# Patient Record
Sex: Male | Born: 1945 | Race: White | Hispanic: No | Marital: Married | Smoking: Former smoker
Health system: Southern US, Community
[De-identification: ages and names within clinical notes are randomized; demographics above are authoritative.]

## PROBLEM LIST (undated history)

## (undated) DIAGNOSIS — G4730 Sleep apnea, unspecified: Secondary | ICD-10-CM

## (undated) DIAGNOSIS — E785 Hyperlipidemia, unspecified: Secondary | ICD-10-CM

## (undated) DIAGNOSIS — I10 Essential (primary) hypertension: Secondary | ICD-10-CM

## (undated) HISTORY — DX: Hyperlipidemia, unspecified: E78.5

## (undated) HISTORY — DX: Essential (primary) hypertension: I10

## (undated) HISTORY — DX: Sleep apnea, unspecified: G47.30

---

## 2016-06-13 ENCOUNTER — Ambulatory Visit (INDEPENDENT_AMBULATORY_CARE_PROVIDER_SITE_OTHER): Payer: Medicare HMO | Admitting: Podiatry

## 2016-06-13 ENCOUNTER — Encounter: Payer: Self-pay | Admitting: Podiatry

## 2016-06-13 VITALS — BP 145/68 | HR 64 | Ht 75.0 in | Wt 269.0 lb

## 2016-06-13 DIAGNOSIS — M216X2 Other acquired deformities of left foot: Secondary | ICD-10-CM

## 2016-06-13 DIAGNOSIS — M205X9 Other deformities of toe(s) (acquired), unspecified foot: Secondary | ICD-10-CM

## 2016-06-13 DIAGNOSIS — M79672 Pain in left foot: Secondary | ICD-10-CM | POA: Diagnosis not present

## 2016-06-13 DIAGNOSIS — M79671 Pain in right foot: Secondary | ICD-10-CM | POA: Diagnosis not present

## 2016-06-13 DIAGNOSIS — M216X1 Other acquired deformities of right foot: Secondary | ICD-10-CM

## 2016-06-13 DIAGNOSIS — M21969 Unspecified acquired deformity of unspecified lower leg: Secondary | ICD-10-CM

## 2016-06-13 NOTE — Progress Notes (Signed)
Pain in the big joint on right foot duration of a year and is getting worse. HTN, Cholesterol, Blood sugar is ok.  SUBJECTIVE: [age]y.o. year old male presents complaining of constant pain in right big joint and occasional pain on left foot. Started a year ago and now has gotten much worse with more pain.  REVIEW OF SYSTEMS: A comprehensive review of systems was negative except for: Hypertension and High Cholesterol.   OBJECTIVE: DERMATOLOGIC EXAMINATION: No abnormal skin lesions.  VASCULAR EXAMINATION OF LOWER LIMBS: All pedal pulses are palpable with normal pulsation.  Capillary Filling times within 3 seconds in all digits.  Mild edema and erythema over the first MPJ right. Temperature gradient from tibial crest to dorsum of foot is within normal bilateral.  NEUROLOGIC EXAMINATION OF THE LOWER LIMBS: All epicritic and tactile sensations grossly intact. Sharp and Dull discriminatory sensations at the plantar ball of hallux is intact bilateral.   MUSCULOSKELETAL EXAMINATION: Positive for limited first MPJ motion R>L. Positive for elevated and enlarged first Metatarsal head bilateral R>L.  RADIOGRAPHIC STUDIES:  AP View:  Long first metatarsal with narrowing of joint space first MPJ R>L. Increased lateral deviation angle of the CCJ bilateral. Lateral view:  Severely elevated first ray with dorsal spur at the head of the first metatarsal and at the base of the proximal phalanx bilateral. Plantar calcaneal spur bilateral.  ASSESSMENT: Hallux limtitus bilateral. Bone spur dorsal first MPJ bilateral R>L. Metatarsus primus elevatus bilateral.  PLAN: Reviewed clinical findings and available treatment options, injection, orthotics, surgical. Surgery consent form signed for Biplane osteotomy of the first metatarsal right. Right first MPJ injected with mixture of 4 mg Dexamethasone, 4 mg Triamcinolone, and 1 cc of 0.5% Marcaine plain. Patient tolerated well without difficulty.  Patient  will contact us when he is ready for the right foot surgery.

## 2016-06-13 NOTE — Patient Instructions (Addendum)
Seen for pain in right first MPJ. Noted of limited joint motion with spur and bone fragment in joint space on top. May require decompression osteotomy with biplane plantar flexory osteotomy with cleaning of spur and loose bone. Cortisone injection given to right first MPJ. Return or call when ready for surgery or if needed another injection.

## 2016-11-27 ENCOUNTER — Ambulatory Visit (INDEPENDENT_AMBULATORY_CARE_PROVIDER_SITE_OTHER): Payer: Medicare HMO | Admitting: Podiatry

## 2016-11-27 ENCOUNTER — Encounter: Payer: Self-pay | Admitting: Podiatry

## 2016-11-27 DIAGNOSIS — M25571 Pain in right ankle and joints of right foot: Secondary | ICD-10-CM

## 2016-11-27 DIAGNOSIS — M205X9 Other deformities of toe(s) (acquired), unspecified foot: Secondary | ICD-10-CM | POA: Diagnosis not present

## 2016-11-27 DIAGNOSIS — M21969 Unspecified acquired deformity of unspecified lower leg: Secondary | ICD-10-CM | POA: Diagnosis not present

## 2016-11-27 NOTE — Progress Notes (Signed)
SUBJECTIVE: 71 y.o. year old male accompanied by his wife presents complaining of constant pain in right big joint and and request for injection. The last injection in April 2018 lasted a good while.  HPI: Pain in the big joint on right foot duration of a year and is getting worse. HTN, Cholesterol, Blood sugar is ok.   OBJECTIVE: DERMATOLOGIC EXAMINATION: No abnormal skin lesions.  VASCULAR EXAMINATION OF LOWER LIMBS: All pedal pulses are palpable with normal pulsation.  Capillary Filling times within 3 seconds in all digits.  Mild edema and erythema over the first MPJ right. Temperature gradient from tibial crest to dorsum of foot is within normal bilateral.  NEUROLOGIC EXAMINATION OF THE LOWER LIMBS: All epicritic and tactile sensations grossly intact. Sharp and Dull discriminatory sensations at the plantar ball of hallux is intact bilateral.   MUSCULOSKELETAL EXAMINATION: Positive for limited first MPJ motion R>L. Positive for elevated and enlarged first Metatarsal head bilateral R>L.  Previous x-ray study show; Long first metatarsal with narrowing of joint space first MPJ R>L. Increased lateral deviation angle of the CCJ bilateral. Lateral view:  Severely elevated first ray with dorsal spur at the head of the first metatarsal and at the base of the proximal phalanx bilateral. Plantar calcaneal spur bilateral.  ASSESSMENT: Hallux limtitus bilateral. Bone spur dorsal first MPJ bilateral R>L. Metatarsus primus elevatus bilateral.  PLAN: Reviewed clinical findings and available treatment options, injection, orthotics, surgical. Right first MPJ injected with mixture of 4 mg Dexamethasone, 4 mg Triamcinolone, and 1 cc of 0.5% Marcaine plain. Patient tolerated well without difficulty.  Patient will return in December for pre op paper for Biplane [facility] bunionectomy right foot.

## 2016-11-27 NOTE — Patient Instructions (Signed)
Seen for painful joint right great toe. Injection given. Return in December for pre op paper to re sign.

## 2017-02-26 ENCOUNTER — Ambulatory Visit: Payer: Medicare HMO | Admitting: Podiatry

## 2017-02-26 ENCOUNTER — Encounter: Payer: Self-pay | Admitting: Podiatry

## 2017-02-26 DIAGNOSIS — M25571 Pain in right ankle and joints of right foot: Secondary | ICD-10-CM | POA: Diagnosis not present

## 2017-02-26 DIAGNOSIS — M21969 Unspecified acquired deformity of unspecified lower leg: Secondary | ICD-10-CM | POA: Diagnosis not present

## 2017-02-26 DIAGNOSIS — M205X9 Other deformities of toe(s) (acquired), unspecified foot: Secondary | ICD-10-CM | POA: Diagnosis not present

## 2017-02-26 NOTE — Progress Notes (Signed)
Subjective: 71 y.o. year old male patient presents accompanied by his wife for pre op on right big joint pain. Patient and wife wants to have the right foot surgery in January 2019. Been having pain for at lease 2 years. Also developed a painful knot under the 2nd MPJ area of left foot that is getting better now.   History of back pain from old injury and takes Ibuprofen. Been treated for HTN, hihg Cholesterol. Blood sugar run 130-145.                 Objective: Dermatologic: Thick yellow deformed nails both great toes. Vascular: Pedal pulses are all palpable in normal pulsation PT, faintly palpable DP bilateral. Orthopedic: Enlarged metatarsal head, elevated first ray, long first metatarsal, limited first MPJ dorsiflexion bilateral. Neurologic: All epicritic and tactile sensations grossly intact.  Assessment: Painful Hallux limitus with long first ray bilateral R>L.  Treatment: Reviewed clinical findings and available options. Biplane [place] procedure reviewed with both patient and his wife.

## 2017-02-26 NOTE — Patient Instructions (Signed)
Seen for Pre op consultation on right foot for painful joint and bunion. Reviewed proposed procedure and questions answered. Will schedule right foot surgery in January 2019.

## 2017-03-12 ENCOUNTER — Ambulatory Visit (HOSPITAL_BASED_OUTPATIENT_CLINIC_OR_DEPARTMENT_OTHER)
Admission: RE | Admit: 2017-03-12 | Discharge: 2017-03-12 | Disposition: A | Discharge status: Home / Self Care | Payer: Medicare HMO | Source: Ambulatory Visit | Attending: Cardiology | Admitting: Cardiology

## 2017-03-12 ENCOUNTER — Encounter: Payer: Self-pay | Admitting: Cardiology

## 2017-03-12 ENCOUNTER — Ambulatory Visit (INDEPENDENT_AMBULATORY_CARE_PROVIDER_SITE_OTHER): Payer: Medicare HMO | Admitting: Cardiology

## 2017-03-12 VITALS — BP 140/80 | HR 71 | Ht 75.0 in | Wt 272.0 lb

## 2017-03-12 DIAGNOSIS — I10 Essential (primary) hypertension: Secondary | ICD-10-CM

## 2017-03-12 DIAGNOSIS — E785 Hyperlipidemia, unspecified: Secondary | ICD-10-CM

## 2017-03-12 DIAGNOSIS — R079 Chest pain, unspecified: Secondary | ICD-10-CM | POA: Insufficient documentation

## 2017-03-12 NOTE — Progress Notes (Signed)
Cardiology Office Note:    Date:  03/12/2017   ID:  Randy GauzeFarrell C Dullea, DOB 02-14-1946, MRN [id number]  PCP:  Patient, No Pcp Per  Cardiologist:  Norman HerrlichBrian Jersee Winiarski, MD   Referring MD: No ref. provider found  ASSESSMENT:    1. Chest pain in adult   2. Essential hypertension   3. Hyperlipidemia, unspecified hyperlipidemia type    PLAN:    In order of problems listed above:  1. With abnormal myocardial perfusion study from March 2017 I feel he should undergo coronary angiography as he is at high risk.  He is improved since resuming beta-blocker will continue aspirin beta blocker and statin check troponin with his labs today and plan coronary angiography in the next 48 hours.  Options benefits and risks detailed patient and wife are in agreement he has no dye allergy or known renal insufficiency. 2. Stable continue current treatment including ACE inhibitor beta-blocker 3. Stable continue statin, if he has obstructive CAD he will need high intensity statin  Next appointment 2 weeks, I did ask him to cancel his elective foot surgery for this week   Medication Adjustments/Labs and Tests Ordered: Current medicines are reviewed at length with the patient today.  Concerns regarding medicines are outlined above.  Orders Placed This Encounter  Procedures  . DG Chest 2 View  . CBC with Differential/Platelet  . Basic metabolic panel  . Protime-INR  . Troponin I  . EKG 12-Lead   No orders of the defined types were placed in this encounter.    Chief Complaint  Patient presents with  . Chest Pain    History of Present Illness:    Randy Howell is a 72 y.o. male who is being seen today for the evaluation of chest pain at his request . [facility] was abnormal with inferolateral ischemia.  He was seen by a cardiologist he was felt to have at continuation he was advised to continue medical treatment although wife said there was some discussion of undergoing further evaluation but it  was never scheduled he also has incomplete right bundle branch block on previous EKG. echocardiogram March 2017 showed normal left ventricular ejection fraction mild LVH and grade 2 diastolic dysfunction  Recently he has not felt well and has been limited mostly by foot pain is actually scheduled for outpatient elective podiatric surgery this Friday.  In the last 2 weeks he has had an increasing frequency of aching in the left upper extremity that radiates into the chest without activities.  The symptoms have been off and on now for 2 weeks actually better now and was so severe over the weekend that he thought of going to the emergency room for evaluation.  It is not pleuritic he is not short of breath no diaphoresis or nausea nonexertional on relieved with rest.  His wife intervened and scheduled appointment with me today.  He has multiple cardiovascular risk including hypertension hyperlipidemia previous cigarette smoking and sleep apnea.  He is compliant with his CPAP.  On his own he had discontinued beta-blocker and his systolic blood pressures up to 200 systolic over the weekend and placed himself back on the beta-blocker and attributes his improvement to it.  His outpatient medical regimen includes aspirin beta-blocker and statin he has no known history of congenital rheumatic heart disease   Past Medical History:  Diagnosis Date  . Hyperlipidemia   . Hypertension   . Sleep apnea     History reviewed. No pertinent  surgical history.  Current Medications: Current Meds  Medication Sig  . aspirin EC 81 MG tablet Take 81 mg by mouth daily.  . carvedilol (COREG) 6.25 MG tablet Take 6.25 mg by mouth 2 (two) times daily with a meal. Half tab bid  . cetirizine (ZYRTEC) 10 MG tablet Take 10 mg by mouth daily.  Marland Kitchen co-enzyme Q-10 30 MG capsule Take 30 mg by mouth daily.  . Glucos-Chond-Hyal Ac-Ca Fructo (MOVE FREE JOINT HEALTH ADVANCE PO) Take by mouth daily.  Marland Kitchen ibuprofen (ADVIL,MOTRIN) 800 MG tablet  Take 800 mg by mouth every 8 (eight) hours as needed.  Marland Kitchen lisinopril (PRINIVIL,ZESTRIL) 40 MG tablet Take 40 mg by mouth daily.  . Multiple Vitamins-Minerals (CENTRUM SILVER 50+MEN PO) Take by mouth daily.  . simvastatin (ZOCOR) 80 MG tablet Take 80 mg by mouth daily. Half tab daily     Allergies:   Patient has no known allergies.   Social History   Socioeconomic History  . Marital status: Married    Spouse name: None  . Number of children: None  . Years of education: None  . Highest education level: None  Social Needs  . Financial resource strain: None  . Food insecurity - worry: None  . Food insecurity - inability: None  . Transportation needs - medical: None  . Transportation needs - non-medical: None  Occupational History  . None  Tobacco Use  . Smoking status: Former Games developer  . Smokeless tobacco: Never Used  Substance and Sexual Activity  . Alcohol use: None  . Drug use: None  . Sexual activity: None  Other Topics Concern  . None  Social History Narrative  . None     Family History: The patient's family history includes Cancer in his mother; Liver cancer in his father.  ROS:   Review of Systems  Constitution: Negative.  HENT: Negative.   Eyes: Negative.   Cardiovascular: Positive for chest pain.  Respiratory: Negative.   Endocrine: Negative.   Hematologic/Lymphatic: Negative.   Skin: Negative.   Musculoskeletal: Positive for joint pain (foot).  Gastrointestinal: Negative.   Genitourinary: Negative.   Neurological: Positive for dizziness (with elevated BP).  Psychiatric/Behavioral: Negative.   Allergic/Immunologic: Negative.    Please see the history of present illness.     All other systems reviewed and are negative.  EKGs/Labs/Other Studies Reviewed:    The following studies were reviewed today: Records from the [facility] handcarried with the patient and I reviewed his echocardiogram myocardial perfusion study and cardiology consultation all from  March 2017 EKG today shows sinus rhythm right bundle branch block no ischemic changes  EKG:  EKG is  ordered today.  The ekg ordered today demonstrates right bundle branch block no acute ischemic changes  Recent Labs: No results found for requested labs within last 8760 hours.  Recent Lipid Panel No results found for: CHOL, TRIG, HDL, CHOLHDL, VLDL, LDLCALC, LDLDIRECT  Physical Exam:    VS:  BP 140/80 (BP Location: Left Arm, Patient Position: Sitting, Cuff Size: Large)   Pulse 71   Ht 6\' 3"  (1.905 m)   Wt 272 lb (123.4 kg)   SpO2 97%   BMI 34.00 kg/m     Wt Readings from Last 3 Encounters:  03/12/17 272 lb (123.4 kg)  06/13/16 269 lb (122 kg)     GEN:  Well nourished, well developed in no acute distress HEENT: Normal NECK: No JVD; No carotid bruits LYMPHATICS: No lymphadenopathy CARDIAC: RRR, no murmurs, rubs, gallops RESPIRATORY:  Clear to auscultation without rales, wheezing or rhonchi  ABDOMEN: Soft, non-tender, non-distended MUSCULOSKELETAL:  No edema; No deformity  SKIN: Warm and dry NEUROLOGIC:  Alert and oriented x 3 PSYCHIATRIC:  Normal affect     Signed, Norman Herrlich, MD  03/12/2017 11:24 AM    [facility]

## 2017-03-12 NOTE — H&P (View-Only) (Signed)
Cardiology Office Note:    Date:  03/12/2017   ID:  Randy GauzeFarrell C Dullea, DOB 02-14-1946, MRN [id number]  PCP:  Patient, No Pcp Per  Cardiologist:  Norman HerrlichBrian Rania Prothero, MD   Referring MD: No ref. provider found  ASSESSMENT:    1. Chest pain in adult   2. Essential hypertension   3. Hyperlipidemia, unspecified hyperlipidemia type    PLAN:    In order of problems listed above:  1. With abnormal myocardial perfusion study from March 2017 I feel he should undergo coronary angiography as he is at high risk.  He is improved since resuming beta-blocker will continue aspirin beta blocker and statin check troponin with his labs today and plan coronary angiography in the next 48 hours.  Options benefits and risks detailed patient and wife are in agreement he has no dye allergy or known renal insufficiency. 2. Stable continue current treatment including ACE inhibitor beta-blocker 3. Stable continue statin, if he has obstructive CAD he will need high intensity statin  Next appointment 2 weeks, I did ask him to cancel his elective foot surgery for this week   Medication Adjustments/Labs and Tests Ordered: Current medicines are reviewed at length with the patient today.  Concerns regarding medicines are outlined above.  Orders Placed This Encounter  Procedures  . DG Chest 2 View  . CBC with Differential/Platelet  . Basic metabolic panel  . Protime-INR  . Troponin I  . EKG 12-Lead   No orders of the defined types were placed in this encounter.    Chief Complaint  Patient presents with  . Chest Pain    History of Present Illness:    Randy Howell is a 72 y.o. male who is being seen today for the evaluation of chest pain at his request . [facility] was abnormal with inferolateral ischemia.  He was seen by a cardiologist he was felt to have at continuation he was advised to continue medical treatment although wife said there was some discussion of undergoing further evaluation but it  was never scheduled he also has incomplete right bundle branch block on previous EKG. echocardiogram March 2017 showed normal left ventricular ejection fraction mild LVH and grade 2 diastolic dysfunction  Recently he has not felt well and has been limited mostly by foot pain is actually scheduled for outpatient elective podiatric surgery this Friday.  In the last 2 weeks he has had an increasing frequency of aching in the left upper extremity that radiates into the chest without activities.  The symptoms have been off and on now for 2 weeks actually better now and was so severe over the weekend that he thought of going to the emergency room for evaluation.  It is not pleuritic he is not short of breath no diaphoresis or nausea nonexertional on relieved with rest.  His wife intervened and scheduled appointment with me today.  He has multiple cardiovascular risk including hypertension hyperlipidemia previous cigarette smoking and sleep apnea.  He is compliant with his CPAP.  On his own he had discontinued beta-blocker and his systolic blood pressures up to 200 systolic over the weekend and placed himself back on the beta-blocker and attributes his improvement to it.  His outpatient medical regimen includes aspirin beta-blocker and statin he has no known history of congenital rheumatic heart disease   Past Medical History:  Diagnosis Date  . Hyperlipidemia   . Hypertension   . Sleep apnea     History reviewed. No pertinent  surgical history.  Current Medications: Current Meds  Medication Sig  . aspirin EC 81 MG tablet Take 81 mg by mouth daily.  . carvedilol (COREG) 6.25 MG tablet Take 6.25 mg by mouth 2 (two) times daily with a meal. Half tab bid  . cetirizine (ZYRTEC) 10 MG tablet Take 10 mg by mouth daily.  Marland Kitchen co-enzyme Q-10 30 MG capsule Take 30 mg by mouth daily.  . Glucos-Chond-Hyal Ac-Ca Fructo (MOVE FREE JOINT HEALTH ADVANCE PO) Take by mouth daily.  Marland Kitchen ibuprofen (ADVIL,MOTRIN) 800 MG tablet  Take 800 mg by mouth every 8 (eight) hours as needed.  Marland Kitchen lisinopril (PRINIVIL,ZESTRIL) 40 MG tablet Take 40 mg by mouth daily.  . Multiple Vitamins-Minerals (CENTRUM SILVER 50+MEN PO) Take by mouth daily.  . simvastatin (ZOCOR) 80 MG tablet Take 80 mg by mouth daily. Half tab daily     Allergies:   Patient has no known allergies.   Social History   Socioeconomic History  . Marital status: Married    Spouse name: None  . Number of children: None  . Years of education: None  . Highest education level: None  Social Needs  . Financial resource strain: None  . Food insecurity - worry: None  . Food insecurity - inability: None  . Transportation needs - medical: None  . Transportation needs - non-medical: None  Occupational History  . None  Tobacco Use  . Smoking status: Former Games developer  . Smokeless tobacco: Never Used  Substance and Sexual Activity  . Alcohol use: None  . Drug use: None  . Sexual activity: None  Other Topics Concern  . None  Social History Narrative  . None     Family History: The patient's family history includes Cancer in his mother; Liver cancer in his father.  ROS:   Review of Systems  Constitution: Negative.  HENT: Negative.   Eyes: Negative.   Cardiovascular: Positive for chest pain.  Respiratory: Negative.   Endocrine: Negative.   Hematologic/Lymphatic: Negative.   Skin: Negative.   Musculoskeletal: Positive for joint pain (foot).  Gastrointestinal: Negative.   Genitourinary: Negative.   Neurological: Positive for dizziness (with elevated BP).  Psychiatric/Behavioral: Negative.   Allergic/Immunologic: Negative.    Please see the history of present illness.     All other systems reviewed and are negative.  EKGs/Labs/Other Studies Reviewed:    The following studies were reviewed today: Records from the [facility] handcarried with the patient and I reviewed his echocardiogram myocardial perfusion study and cardiology consultation all from  March 2017 EKG today shows sinus rhythm right bundle branch block no ischemic changes  EKG:  EKG is  ordered today.  The ekg ordered today demonstrates right bundle branch block no acute ischemic changes  Recent Labs: No results found for requested labs within last 8760 hours.  Recent Lipid Panel No results found for: CHOL, TRIG, HDL, CHOLHDL, VLDL, LDLCALC, LDLDIRECT  Physical Exam:    VS:  BP 140/80 (BP Location: Left Arm, Patient Position: Sitting, Cuff Size: Large)   Pulse 71   Ht 6\' 3"  (1.905 m)   Wt 272 lb (123.4 kg)   SpO2 97%   BMI 34.00 kg/m     Wt Readings from Last 3 Encounters:  03/12/17 272 lb (123.4 kg)  06/13/16 269 lb (122 kg)     GEN:  Well nourished, well developed in no acute distress HEENT: Normal NECK: No JVD; No carotid bruits LYMPHATICS: No lymphadenopathy CARDIAC: RRR, no murmurs, rubs, gallops RESPIRATORY:  Clear to auscultation without rales, wheezing or rhonchi  ABDOMEN: Soft, non-tender, non-distended MUSCULOSKELETAL:  No edema; No deformity  SKIN: Warm and dry NEUROLOGIC:  Alert and oriented x 3 PSYCHIATRIC:  Normal affect     Signed, Norman Herrlich, MD  03/12/2017 11:24 AM    [facility]

## 2017-03-12 NOTE — Patient Instructions (Signed)
Medication Instructions:  Your physician recommends that you continue on your current medications as directed. Please refer to the Current Medication list given to you today.  Labwork: Your physician recommends that you return for lab work in: today. BMP, CBC, pt/inr, troponin  Testing/Procedures: You had an EKG today.  A chest x-ray takes a picture of the organs and structures inside the chest, including the heart, lungs, and blood vessels. This test can show several things, including, whether the heart is enlarges; whether fluid is building up in the lungs; and whether pacemaker / defibrillator leads are still in place.  Your physician has requested that you have a cardiac catheterization. Cardiac catheterization is used to diagnose and/or treat various heart conditions. Doctors may recommend this procedure for a number of different reasons. The most common reason is to evaluate chest pain. Chest pain can be a symptom of coronary artery disease (CAD), and cardiac catheterization can show whether plaque is narrowing or blocking your heart's arteries. This procedure is also used to evaluate the valves, as well as measure the blood flow and oxygen levels in different parts of your heart. For further information please visit [email or web address] Please follow instruction sheet, as given.    [facility] [address], [address] [place] [address] Dept: [phone] Loc: [phone]  Randy Howell  03/12/2017  You are scheduled for a Cardiac Catheterization on Friday, January 4 with Dr. Peter Howell.  1. [facility]: [address] [place], [place] [address] at 5:30 AM (two hours before your procedure to ensure your preparation). Free valet parking service is available.   Special note: Every effort is made to have your procedure done on time.  Please understand that emergencies sometimes delay scheduled procedures.  2. Diet: Do not eat or drink anything after midnight prior to your procedure except sips of water to take medications.  3. Labs: We are checking today.  4. Medication instructions in preparation for your procedure:  On the morning of your procedure, take your Aspirin and any morning medicines NOT listed above.  You may use sips of water.  5. Plan for one night stay--bring personal belongings. 6. Bring a current list of your medications and current insurance cards. 7. You MUST have a responsible person to drive you home. 8. Someone MUST be with you the first 24 hours after you arrive home or your discharge will be delayed. 9. Please wear clothes that are easy to get on and off and wear slip-on shoes.  Thank you for allowing us to care for you!   -- [facility]   Follow-Up: Your physician recommends that you schedule a follow-up appointment in: 2 weeks.  Any Other Special Instructions Will Be Listed Below (If Applicable).     If you need a refill on your cardiac medications before your next appointment, please call your pharmacy.

## 2017-03-13 LAB — PROTIME-INR
INR: 1.1 (ref 0.8–1.2)
Prothrombin Time: 10.9 s (ref 9.1–12.0)

## 2017-03-13 LAB — CBC WITH DIFFERENTIAL/PLATELET
Basophils Absolute: 0 10*3/uL (ref 0.0–0.2)
Basos: 1 %
EOS (ABSOLUTE): 0.1 10*3/uL (ref 0.0–0.4)
EOS: 2 %
HEMOGLOBIN: 14.5 g/dL (ref 13.0–17.7)
Hematocrit: 42.6 % (ref 37.5–51.0)
IMMATURE GRANS (ABS): 0 10*3/uL (ref 0.0–0.1)
IMMATURE GRANULOCYTES: 0 %
LYMPHS: 35 %
Lymphocytes Absolute: 2 10*3/uL (ref 0.7–3.1)
MCH: 31.3 pg (ref 26.6–33.0)
MCHC: 34 g/dL (ref 31.5–35.7)
MCV: 92 fL (ref 79–97)
MONOCYTES: 11 %
Monocytes Absolute: 0.7 10*3/uL (ref 0.1–0.9)
NEUTROS PCT: 51 %
Neutrophils Absolute: 3 10*3/uL (ref 1.4–7.0)
PLATELETS: 239 10*3/uL (ref 150–379)
RBC: 4.63 x10E6/uL (ref 4.14–5.80)
RDW: 13.3 % (ref 12.3–15.4)
WBC: 5.8 10*3/uL (ref 3.4–10.8)

## 2017-03-13 LAB — BASIC METABOLIC PANEL
BUN/Creatinine Ratio: 14 (ref 10–24)
BUN: 13 mg/dL (ref 8–27)
CALCIUM: 9.7 mg/dL (ref 8.6–10.2)
CHLORIDE: 100 mmol/L (ref 96–106)
CO2: 25 mmol/L (ref 20–29)
CREATININE: 0.92 mg/dL (ref 0.76–1.27)
GFR calc non Af Amer: 83 mL/min/{1.73_m2} (ref >59)
GFR, EST AFRICAN AMERICAN: 96 mL/min/{1.73_m2} (ref >59)
Glucose: 187 mg/dL — ABNORMAL HIGH (ref 65–99)
Potassium: 4.5 mmol/L (ref 3.5–5.2)
Sodium: 142 mmol/L (ref 134–144)

## 2017-03-13 LAB — TROPONIN I

## 2017-03-14 ENCOUNTER — Encounter (HOSPITAL_COMMUNITY)
Admission: RE | Disposition: A | Discharge status: Home / Self Care | Payer: Self-pay | Source: Ambulatory Visit | Attending: Cardiology

## 2017-03-14 ENCOUNTER — Ambulatory Visit: Payer: Self-pay | Admitting: Cardiology

## 2017-03-14 ENCOUNTER — Encounter (HOSPITAL_COMMUNITY): Payer: Self-pay | Admitting: Cardiology

## 2017-03-14 ENCOUNTER — Ambulatory Visit (HOSPITAL_COMMUNITY)
Admission: RE | Admit: 2017-03-14 | Discharge: 2017-03-14 | Disposition: A | Discharge status: Home / Self Care | Payer: Medicare HMO | Source: Ambulatory Visit | Attending: Cardiology | Admitting: Cardiology

## 2017-03-14 DIAGNOSIS — Z9989 Dependence on other enabling machines and devices: Secondary | ICD-10-CM | POA: Insufficient documentation

## 2017-03-14 DIAGNOSIS — Z87891 Personal history of nicotine dependence: Secondary | ICD-10-CM | POA: Diagnosis not present

## 2017-03-14 DIAGNOSIS — G4730 Sleep apnea, unspecified: Secondary | ICD-10-CM | POA: Diagnosis not present

## 2017-03-14 DIAGNOSIS — I10 Essential (primary) hypertension: Secondary | ICD-10-CM | POA: Diagnosis present

## 2017-03-14 DIAGNOSIS — Z7982 Long term (current) use of aspirin: Secondary | ICD-10-CM | POA: Insufficient documentation

## 2017-03-14 DIAGNOSIS — E785 Hyperlipidemia, unspecified: Secondary | ICD-10-CM | POA: Insufficient documentation

## 2017-03-14 DIAGNOSIS — Z79899 Other long term (current) drug therapy: Secondary | ICD-10-CM | POA: Insufficient documentation

## 2017-03-14 DIAGNOSIS — R079 Chest pain, unspecified: Secondary | ICD-10-CM | POA: Insufficient documentation

## 2017-03-14 DIAGNOSIS — R9439 Abnormal result of other cardiovascular function study: Secondary | ICD-10-CM

## 2017-03-14 HISTORY — PX: LEFT HEART CATH AND CORONARY ANGIOGRAPHY: CATH118249

## 2017-03-14 SURGERY — LEFT HEART CATH AND CORONARY ANGIOGRAPHY
Anesthesia: LOCAL

## 2017-03-14 MED ORDER — SODIUM CHLORIDE 0.9 % WEIGHT BASED INFUSION
3.0000 mL/kg/h | INTRAVENOUS | Status: AC
  Administered 2017-03-14: 3 mL/kg/h via INTRAVENOUS

## 2017-03-14 MED ORDER — SODIUM CHLORIDE 0.9 % WEIGHT BASED INFUSION: 1.0000 mL/kg/h | INTRAVENOUS | Status: DC

## 2017-03-14 MED ORDER — IOPAMIDOL (ISOVUE-370) INJECTION 76%
INTRAVENOUS | Status: AC
  Filled 2017-03-14: qty 100

## 2017-03-14 MED ORDER — FENTANYL CITRATE (PF) 100 MCG/2ML IJ SOLN
INTRAMUSCULAR | Status: AC
  Filled 2017-03-14: qty 2

## 2017-03-14 MED ORDER — MIDAZOLAM HCL 2 MG/2ML IJ SOLN
INTRAMUSCULAR | Status: DC | PRN
  Administered 2017-03-14: 1 mg via INTRAVENOUS

## 2017-03-14 MED ORDER — SODIUM CHLORIDE 0.9 % IV SOLN: 250.0000 mL | INTRAVENOUS | Status: DC | PRN

## 2017-03-14 MED ORDER — VERAPAMIL HCL 2.5 MG/ML IV SOLN
INTRAVENOUS | Status: AC
  Filled 2017-03-14: qty 2

## 2017-03-14 MED ORDER — ASPIRIN 81 MG PO CHEW: 81.0000 mg | CHEWABLE_TABLET | ORAL | Status: AC

## 2017-03-14 MED ORDER — HEPARIN (PORCINE) IN NACL 2-0.9 UNIT/ML-% IJ SOLN
INTRAMUSCULAR | Status: AC | PRN
  Administered 2017-03-14: 1000 mL

## 2017-03-14 MED ORDER — LIDOCAINE HCL (PF) 1 % IJ SOLN
INTRAMUSCULAR | Status: AC
  Filled 2017-03-14: qty 30

## 2017-03-14 MED ORDER — HEPARIN (PORCINE) IN NACL 2-0.9 UNIT/ML-% IJ SOLN
INTRAMUSCULAR | Status: AC
  Filled 2017-03-14: qty 1000

## 2017-03-14 MED ORDER — HEPARIN SODIUM (PORCINE) 1000 UNIT/ML IJ SOLN
INTRAMUSCULAR | Status: AC
  Filled 2017-03-14: qty 1

## 2017-03-14 MED ORDER — MIDAZOLAM HCL 2 MG/2ML IJ SOLN
INTRAMUSCULAR | Status: AC
  Filled 2017-03-14: qty 2

## 2017-03-14 MED ORDER — SODIUM CHLORIDE 0.9% FLUSH: 3.0000 mL | Freq: Two times a day (BID) | INTRAVENOUS | Status: DC

## 2017-03-14 MED ORDER — VERAPAMIL HCL 2.5 MG/ML IV SOLN
INTRAVENOUS | Status: DC | PRN
  Administered 2017-03-14: 10 mL via INTRA_ARTERIAL

## 2017-03-14 MED ORDER — SODIUM CHLORIDE 0.9% FLUSH: 3.0000 mL | INTRAVENOUS | Status: DC | PRN

## 2017-03-14 MED ORDER — FENTANYL CITRATE (PF) 100 MCG/2ML IJ SOLN
INTRAMUSCULAR | Status: DC | PRN
  Administered 2017-03-14: 25 ug via INTRAVENOUS

## 2017-03-14 MED ORDER — IOPAMIDOL (ISOVUE-370) INJECTION 76%
INTRAVENOUS | Status: DC | PRN
  Administered 2017-03-14: 75 mL via INTRA_ARTERIAL

## 2017-03-14 MED ORDER — HEPARIN SODIUM (PORCINE) 1000 UNIT/ML IJ SOLN
INTRAMUSCULAR | Status: DC | PRN
  Administered 2017-03-14: 6000 [IU] via INTRAVENOUS

## 2017-03-14 MED ORDER — LIDOCAINE HCL (PF) 1 % IJ SOLN
INTRAMUSCULAR | Status: DC | PRN
  Administered 2017-03-14: 1 mL via INTRADERMAL

## 2017-03-14 SURGICAL SUPPLY — 12 items
CATH 5FR JL3.5 JR4 ANG PIG MP (CATHETERS) ×2 IMPLANT
COVER PRB 48X5XTLSCP FOLD TPE (BAG) ×1 IMPLANT
COVER PROBE 5X48 (BAG) ×1
DEVICE RAD COMP TR BAND LRG (VASCULAR PRODUCTS) ×2 IMPLANT
GLIDESHEATH SLEND SS 6F .021 (SHEATH) ×2 IMPLANT
GUIDEWIRE INQWIRE 1.5J.035X260 (WIRE) ×1 IMPLANT
INQWIRE 1.5J .035X260CM (WIRE) ×2
KIT HEART LEFT (KITS) ×2 IMPLANT
PACK CARDIAC CATHETERIZATION (CUSTOM PROCEDURE TRAY) ×2 IMPLANT
SYR MEDRAD MARK V 150ML (SYRINGE) ×2 IMPLANT
TRANSDUCER W/STOPCOCK (MISCELLANEOUS) ×2 IMPLANT
TUBING CIL FLEX 10 FLL-RA (TUBING) ×2 IMPLANT

## 2017-03-14 NOTE — Discharge Instructions (Signed)

## 2017-03-14 NOTE — Interval H&P Note (Signed)
History and Physical Interval Note:  03/14/2017 7:20 AM  Primitivo GauzeFarrell C Kreiser  has presented today for surgery, with the diagnosis of cp - abnormal stress  The various methods of treatment have been discussed with the patient and family. After consideration of risks, benefits and other options for treatment, the patient has consented to  Procedure(s): LEFT HEART CATH AND CORONARY ANGIOGRAPHY (N/A) as a surgical intervention .  The patient's history has been reviewed, patient examined, no change in status, stable for surgery.  I have reviewed the patient's chart and labs.  Questions were answered to the patient's satisfaction.   Cath Lab Visit (complete for each Cath Lab visit)  Clinical Evaluation Leading to the Procedure:   ACS: No.  Non-ACS:    Anginal Classification: CCS II  Anti-ischemic medical therapy: Minimal Therapy (1 class of medications)  Non-Invasive Test Results: Intermediate-risk stress test findings: cardiac mortality 1-3%/year  Prior CABG: No previous CABG        Theron Aristaeter [facility] JoplinJordanMD,FACC 03/14/2017 7:21 AM

## 2017-03-20 ENCOUNTER — Ambulatory Visit: Payer: Medicare HMO | Admitting: Cardiology

## 2017-03-20 ENCOUNTER — Encounter: Payer: Self-pay | Admitting: Cardiology

## 2017-03-20 VITALS — BP 128/76 | HR 75 | Ht 75.0 in | Wt 269.0 lb

## 2017-03-20 DIAGNOSIS — I10 Essential (primary) hypertension: Secondary | ICD-10-CM | POA: Diagnosis not present

## 2017-03-20 DIAGNOSIS — R079 Chest pain, unspecified: Secondary | ICD-10-CM | POA: Diagnosis not present

## 2017-03-20 MED ORDER — HYDROCHLOROTHIAZIDE 25 MG PO TABS: 12.5000 mg | ORAL_TABLET | Freq: Every day | ORAL | 3 refills | Status: AC

## 2017-03-20 MED ORDER — LISINOPRIL 40 MG PO TABS: 40.0000 mg | ORAL_TABLET | Freq: Every day | ORAL | 3 refills | Status: AC

## 2017-03-20 NOTE — Progress Notes (Signed)
Cardiology Office Note:    Date:  03/20/2017   ID:  Randy Howell, DOB 1945-08-04, MRN [id number]  PCP:  [facility]  Cardiologist:  Norman HerrlichBrian Alysah Carton, MD    Referring MD: [facility]    ASSESSMENT:    1. Chest pain in adult   2. Essential hypertension    PLAN:    In order of problems listed above:  1. His had normal coronary arteriography is reassured and has had no further chest pain. 2. Poorly controlled he will take his full dose of ACE inhibitor along with thiazide diuretic start a beta-blocker that he was prescribed.   Next appointment: PRN   Medication Adjustments/Labs and Tests Ordered: Current medicines are reviewed at length with the patient today.  Concerns regarding medicines are outlined above.  No orders of the defined types were placed in this encounter.  Meds ordered this encounter  Medications  . lisinopril (PRINIVIL,ZESTRIL) 40 MG tablet    Sig: Take 1 tablet (40 mg total) by mouth daily.    Dispense:  90 tablet    Refill:  3  . hydrochlorothiazide (HYDRODIURIL) 25 MG tablet    Sig: Take 0.5 tablets (12.5 mg total) by mouth daily.    Dispense:  45 tablet    Refill:  3    Chief Complaint  Patient presents with  . Follow-up    after recent left heart cath    History of Present Illness:    Randy Howell is a 72 y.o. male with a hx of chest pain, CAC and an abnormal MPI last seen 1 week ago and referred for left heart cath. Compliance with diet, lifestyle and medications: yes Home BP remains > 140 systolic.  LEFT HEART CATH AND CORONARY ANGIOGRAPHY 03/14/17  Conclusion    The left ventricular systolic function is normal.  LV end diastolic pressure is normal.  The left ventricular ejection fraction is 55-65% by visual estimate. 1. Normal coronary anatomy 2. Normal LV function 3. Normal LVEDP    Past Medical History:  Diagnosis Date  . Hyperlipidemia   . Hypertension   . Sleep apnea     Past Surgical History:    Procedure Laterality Date  . LEFT HEART CATH AND CORONARY ANGIOGRAPHY N/A 03/14/2017   Procedure: LEFT HEART CATH AND CORONARY ANGIOGRAPHY;  Surgeon: Swazil[place]Jordan, [name], MD;  [facility];  Service: Cardiovascular;  Laterality: N/A;    Current Medications: Current Meds  Medication Sig  . aspirin EC 81 MG tablet Take 81 mg by mouth daily.  . carvedilol (COREG) 6.25 MG tablet Take 3.125 mg by mouth 2 (two) times daily with a meal. Half tab bid   . cetirizine (ZYRTEC) 10 MG tablet Take 10 mg by mouth at bedtime.   . Coenzyme Q10 (CO Q-10) 100 MG CAPS Take 100 mg by mouth daily.  . Glucos-Chond-Hyal Ac-Ca Fructo (MOVE FREE JOINT HEALTH ADVANCE PO) Take 1 tablet by mouth daily.   . hydrochlorothiazide (HYDRODIURIL) 25 MG tablet Take 0.5 tablets (12.5 mg total) by mouth daily.  Marland Kitchen. ibuprofen (ADVIL,MOTRIN) 800 MG tablet Take 800 mg by mouth 2 (two) times daily as needed for moderate pain.   Marland Kitchen. lisinopril (PRINIVIL,ZESTRIL) 40 MG tablet Take 1 tablet (40 mg total) by mouth daily.  . Multiple Vitamins-Minerals (CENTRUM SILVER 50+MEN PO) Take by mouth daily.  . simvastatin (ZOCOR) 80 MG tablet Take 40 mg by mouth at bedtime. Half tab daily   . [DISCONTINUED] hydrochlorothiazide (HYDRODIURIL) 25  MG tablet Take 6.25 mg by mouth daily.  . [DISCONTINUED] lisinopril (PRINIVIL,ZESTRIL) 40 MG tablet Take 40 mg by mouth daily.     Allergies:   Patient has no known allergies.   Social History   Socioeconomic History  . Marital status: Married    Spouse name: None  . Number of children: None  . Years of education: None  . Highest education level: None  Social Needs  . Financial resource strain: None  . Food insecurity - worry: None  . Food insecurity - inability: None  . Transportation needs - medical: None  . Transportation needs - non-medical: None  Occupational History  . None  Tobacco Use  . Smoking status: Former Games developer  . Smokeless tobacco: Never Used  Substance and Sexual  Activity  . Alcohol use: None  . Drug use: None  . Sexual activity: None  Other Topics Concern  . None  Social History Narrative  . None     Family History: The patient's family history includes Cancer in his mother; Liver cancer in his father. ROS:   Please see the history of present illness.    All other systems reviewed and are negative.  EKGs/Labs/Other Studies Reviewed:    The following studies were reviewed today:   Recent Labs: 03/12/2017: BUN 13; Creatinine, Ser 0.92; Hemoglobin 14.5; Platelets 239; Potassium 4.5; Sodium 142  Recent Lipid Panel No results found for: CHOL, TRIG, HDL, CHOLHDL, VLDL, LDLCALC, LDLDIRECT  Physical Exam:    VS:  BP 128/76 (BP Location: Right Arm, Patient Position: Sitting, Cuff Size: Normal)   Pulse 75   Ht 6\' 3"  (1.905 m)   Wt 269 lb (122 kg)   SpO2 97%   BMI 33.62 kg/m     Wt Readings from Last 3 Encounters:  03/20/17 269 lb (122 kg)  03/14/17 272 lb (123.4 kg)  03/12/17 272 lb (123.4 kg)     GEN:  Well nourished, well developed in no acute distress HEENT: Normal NECK: No JVD; No carotid bruits LYMPHATICS: No lymphadenopathy CARDIAC: RRR, no murmurs, rubs, gallops RESPIRATORY:  Clear to auscultation without rales, wheezing or rhonchi  ABDOMEN: Soft, non-tender, non-distended MUSCULOSKELETAL:  No edema; No deformity  SKIN: Warm and dry NEUROLOGIC:  Alert and oriented x 3 PSYCHIATRIC:  Normal affect    Signed, Norman Herrlich, MD  03/20/2017 9:28 AM     [facility]

## 2017-03-20 NOTE — Patient Instructions (Signed)
Medication Instructions:  Your physician has recommended you make the following change in your medication:  INCREASE hydrochlorothiazide to 12.5 mg daily INCREASE lisinopril to 40 mg daily  Labwork: None  Testing/Procedures: None  Follow-Up: Your physician recommends that you schedule a follow-up appointment as needed if symptoms worsen or fail to improve.  Any Other Special Instructions Will Be Listed Below (If Applicable).     If you need a refill on your cardiac medications before your next appointment, please call your pharmacy.

## 2017-03-26 ENCOUNTER — Ambulatory Visit: Payer: Medicare HMO | Admitting: Cardiology

## 2017-04-03 ENCOUNTER — Other Ambulatory Visit: Payer: Self-pay | Admitting: Podiatry

## 2017-04-03 MED ORDER — HYDROCODONE-IBUPROFEN 7.5-200 MG PO TABS: 1.0000 | ORAL_TABLET | Freq: Four times a day (QID) | ORAL | 0 refills | Status: AC | PRN

## 2017-04-04 DIAGNOSIS — M2011 Hallux valgus (acquired), right foot: Secondary | ICD-10-CM | POA: Diagnosis not present

## 2017-04-09 ENCOUNTER — Ambulatory Visit (INDEPENDENT_AMBULATORY_CARE_PROVIDER_SITE_OTHER): Payer: Medicare HMO | Admitting: Podiatry

## 2017-04-09 ENCOUNTER — Encounter: Payer: Self-pay | Admitting: Podiatry

## 2017-04-09 ENCOUNTER — Encounter: Payer: Medicare HMO | Admitting: Podiatry

## 2017-04-09 DIAGNOSIS — M205X9 Other deformities of toe(s) (acquired), unspecified foot: Secondary | ICD-10-CM

## 2017-04-09 NOTE — Progress Notes (Signed)
S/P Biplane [place] bunionectomy 04/04/17. Denies any discomfort. Been getting around with knee scooter. Normal wound without edema or erythema. Dressing change done. Continue with limited weight bearing. Return in one week.

## 2017-04-09 NOTE — Patient Instructions (Signed)
1 week post op wound healing normal. Good range of motion noted. Do daily toe joint exercise. Minimum weight bearing and return in one week.

## 2017-04-16 ENCOUNTER — Encounter: Payer: Self-pay | Admitting: Podiatry

## 2017-04-16 ENCOUNTER — Ambulatory Visit (INDEPENDENT_AMBULATORY_CARE_PROVIDER_SITE_OTHER): Payer: Medicare HMO | Admitting: Podiatry

## 2017-04-16 DIAGNOSIS — M205X9 Other deformities of toe(s) (acquired), unspecified foot: Secondary | ICD-10-CM | POA: Diagnosis not present

## 2017-04-16 DIAGNOSIS — Z9889 Other specified postprocedural states: Secondary | ICD-10-CM

## 2017-04-16 NOTE — Patient Instructions (Signed)
2 weeks post op wound healing normal. Noted of mild skin rash. May require the area more air. Antibiotic cream applied. Home care instruction discussed. Continue semi weight bearing. May start driving in 2 weeks. Return in 3 weeks.

## 2017-04-16 NOTE — Progress Notes (Signed)
2 weeks post op Biplane [place] bunionectomy done on 04/04/17. Wound healing normal at incision site. Mild skin rash 2-3 cm medial and lateral to incision line. Possible due to Tinea? Post op x-ray show osteotomy site in good approximation with internal fixation screws x 2 in place. Antibiotic cream applied. Suture removed. Instructed to do more aeration by keeping the area open at night. Ok to shower. Home care instruction given with supply. May drive in 2 weeks. Return in 3 weeks.

## 2017-05-07 ENCOUNTER — Ambulatory Visit (INDEPENDENT_AMBULATORY_CARE_PROVIDER_SITE_OTHER): Payer: Medicare HMO | Admitting: Podiatry

## 2017-05-07 ENCOUNTER — Encounter: Payer: Self-pay | Admitting: Podiatry

## 2017-05-07 DIAGNOSIS — M205X9 Other deformities of toe(s) (acquired), unspecified foot: Secondary | ICD-10-CM

## 2017-05-07 NOTE — Patient Instructions (Signed)
4 weeks post op wound healing normal. Ok to wear regular shoe with light ambulation. Return in 4 weeks.

## 2017-05-07 NOTE — Progress Notes (Signed)
4 weeks post op wound follow up . S/P Biplane [place] bunionectomy 04/04/17. Denies any problem with surgical foot. Wound has well healed without complication. Has had some rash 2 weeks ago over dressing area that went away.  No edema or erythema noted. Residual suture removed. Compression stockinet dispensed with instruction. Ok to wear regular shoe with light ambulation. Return in 1 month.

## 2017-06-04 ENCOUNTER — Ambulatory Visit (INDEPENDENT_AMBULATORY_CARE_PROVIDER_SITE_OTHER): Payer: Medicare HMO | Admitting: Podiatry

## 2017-06-04 ENCOUNTER — Encounter: Payer: Self-pay | Admitting: Podiatry

## 2017-06-04 DIAGNOSIS — Z9889 Other specified postprocedural states: Secondary | ICD-10-CM

## 2017-06-04 NOTE — Patient Instructions (Signed)
8 weeks post op on right foot.  Normal post op progress without complication. Satisfactory recovery. May resume regular activity as tolerated. Return as needed.

## 2017-06-04 NOTE — Progress Notes (Signed)
8 weeks post op wound follow up . S/P Biplane [place] bunionectomy 04/04/17. Doing well with good joint motion and maintained correction of left first ray. May resume regular activity. Return as needed.

## 2017-08-28 ENCOUNTER — Encounter: Payer: Self-pay | Admitting: Podiatry

## 2017-08-28 ENCOUNTER — Ambulatory Visit: Payer: Medicare HMO | Admitting: Podiatry

## 2017-08-28 DIAGNOSIS — Z9889 Other specified postprocedural states: Secondary | ICD-10-CM

## 2017-08-28 NOTE — Progress Notes (Signed)
72 year old male presents complaining of residual swelling on right foot following foot surgery. S/P 5 months following Biplane [place] bunionectomy right, 04/04/17. Noted of mild forefoot swelling at lateral column, which is expected in some cases. No edema or erythema noted over the surgical site of the first metatarsal bone and joint. May use compression stockinet during the day or compression sock. Post op wound healing with some residual forefoot swelling is considered within normal. Return as needed.

## 2017-08-28 NOTE — Patient Instructions (Signed)
Seen for swelling in right foot, S/P 5 months following bunionectomy right, 04/04/17. Mild forefoot swelling is expected in some cases. May use compression stockinet during the day or compression sock. Post op wound healing with some residual forefoot swelling is considered within normal. Return as needed.

## 2019-03-27 IMAGING — DX DG CHEST 2V
2 series · 2 of 2 positions shown · non-contrast
Comparison: None.

CLINICAL DATA: Chest pain

EXAM:
CHEST  2 VIEW

[chest pa]
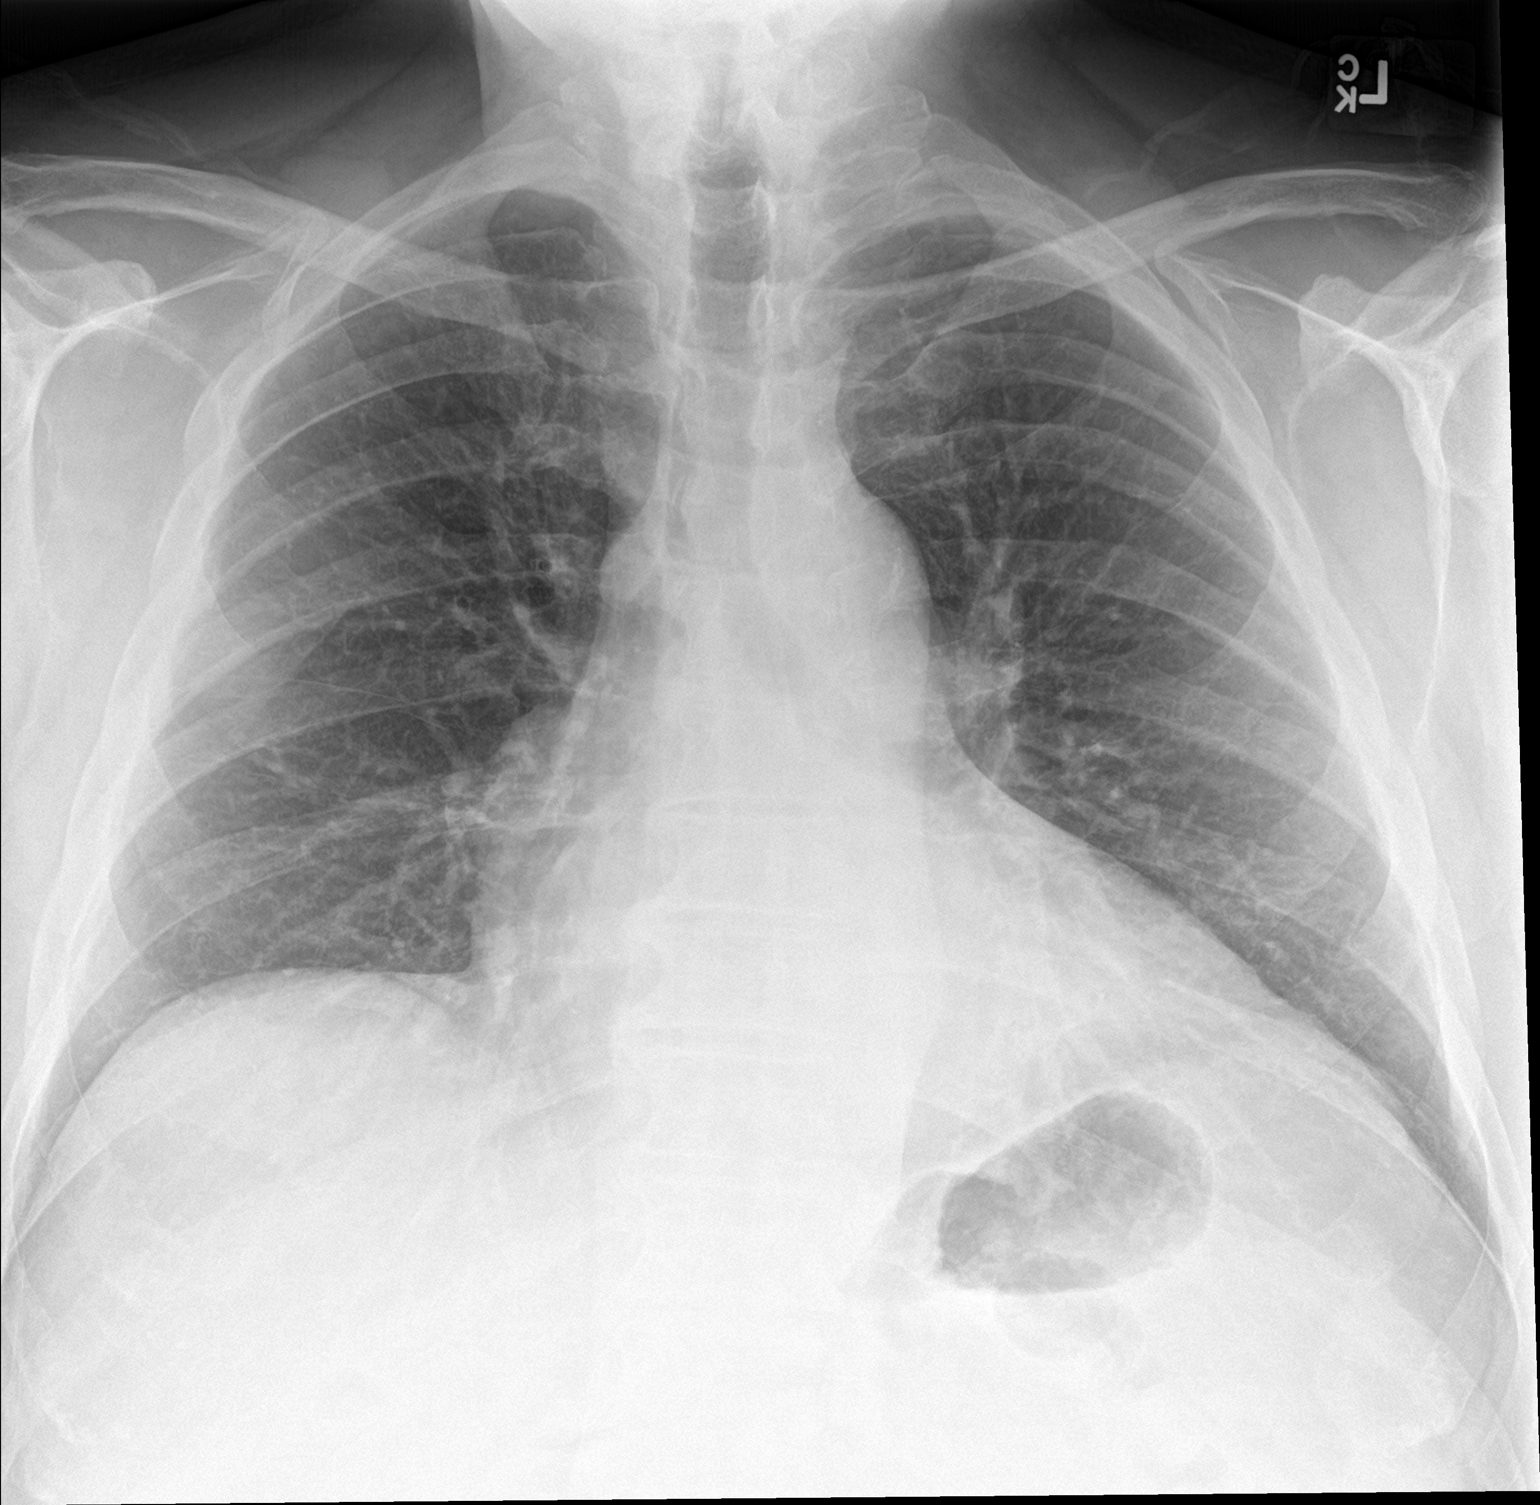

[chest lat]
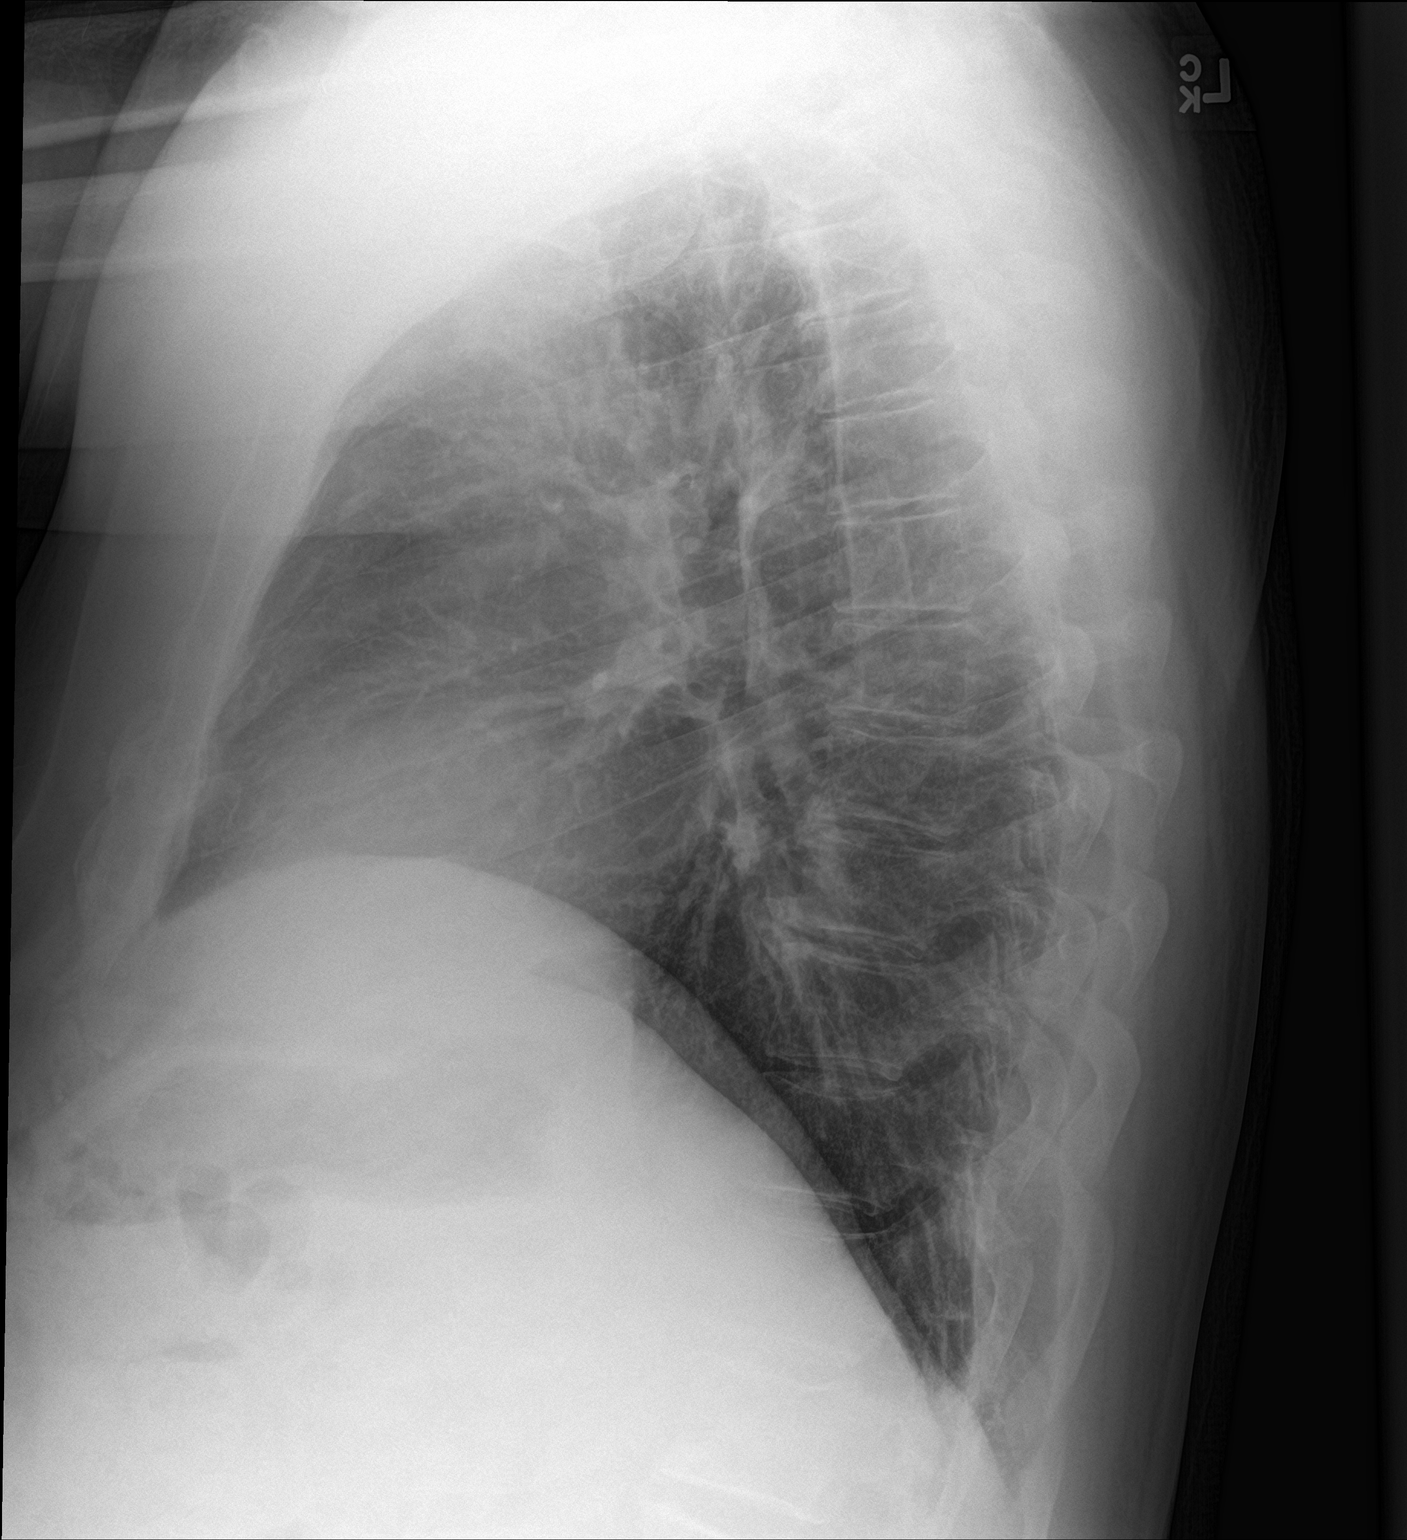

[2 of 2 positions shown; findings below may reference images not displayed]

FINDINGS: The heart size and mediastinal contours are within normal limits.
Both lungs are clear. The visualized skeletal structures are
unremarkable.
IMPRESSION: No active cardiopulmonary disease.

## 2021-05-01 LAB — COLOGUARD: COLOGUARD: POSITIVE — AB

## 2023-11-17 ENCOUNTER — Other Ambulatory Visit (HOSPITAL_BASED_OUTPATIENT_CLINIC_OR_DEPARTMENT_OTHER): Payer: Self-pay

## 2023-11-17 ENCOUNTER — Ambulatory Visit (INDEPENDENT_AMBULATORY_CARE_PROVIDER_SITE_OTHER)
Admission: RE | Admit: 2023-11-17 | Discharge: 2023-11-17 | Disposition: A | Discharge status: Home / Self Care | Source: Ambulatory Visit

## 2023-11-17 ENCOUNTER — Ambulatory Visit (HOSPITAL_BASED_OUTPATIENT_CLINIC_OR_DEPARTMENT_OTHER)

## 2023-11-17 DIAGNOSIS — M47816 Spondylosis without myelopathy or radiculopathy, lumbar region: Secondary | ICD-10-CM | POA: Diagnosis not present
